# Patient Record
Sex: Male | Born: 1965 | Race: White | Hispanic: No | Smoking: Never smoker
Health system: Southern US, Community
[De-identification: ages and names within clinical notes are randomized; demographics above are authoritative.]

---

## 2007-05-16 ENCOUNTER — Ambulatory Visit (HOSPITAL_COMMUNITY): Admission: RE | Admit: 2007-05-16 | Discharge: 2007-05-16 | Payer: Self-pay | Admitting: Urology

## 2009-03-06 IMAGING — CT CT ABD-PELV W/O CM
3 of 8 series · 12 of 42 positions shown, 18 images · IV contrast (CONTRAST)
Comparison: NONE

CLINICAL DATA: Lower abdominal pain. 

CT ABDOMEN AND PELVIS WITHOUT AND WITH INTRAVENOUS AND FOLLOWING 
ORAL  CONTRAST
TECHNIQUE: Multiple axial 5.0-mm-thick slices at 5.0-mm 
intervals were obtained from the lung base through the pelvis 
following the intravenous administration of 100 cc of Optiray 350 
at a rate of 3 cc per second.  Oral contrast was administered as 
well.  Arterial and venous phase imaging was obtained in the upper 
abdomen with delayed images obtained through the pelvis.

[Series 4: venous · axial · portal-venous · 0.76mm/px · z∈[+872,+1178]mm · 5 of 93 slices shown, 10 images]
[im 16/93  soft-tissue]
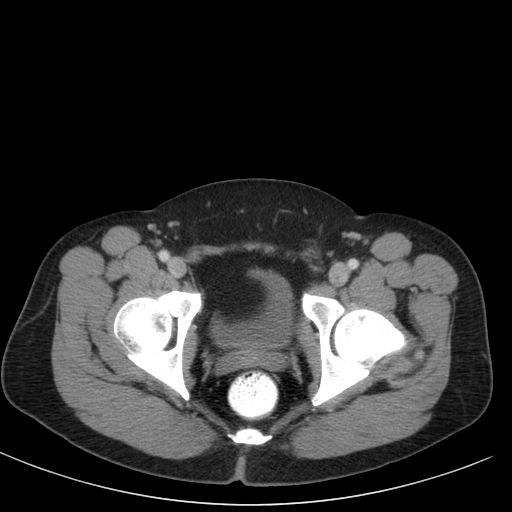
[im 16/93  bone]
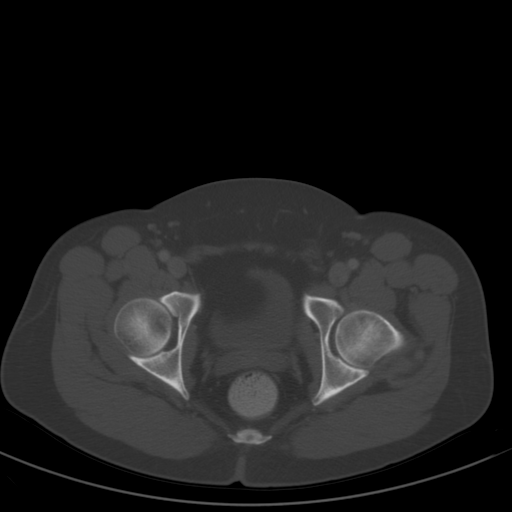
[im 31/93  soft-tissue]
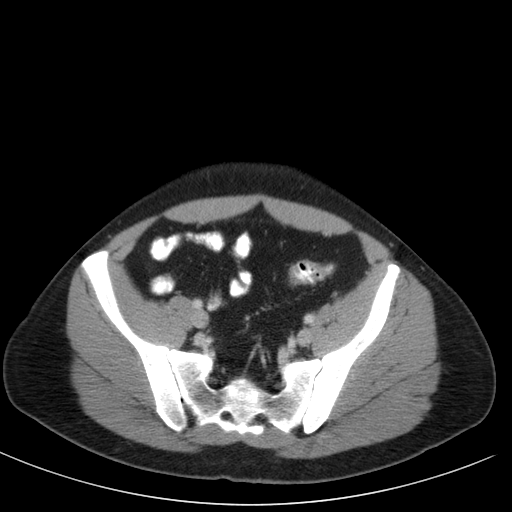
[im 31/93  lung]
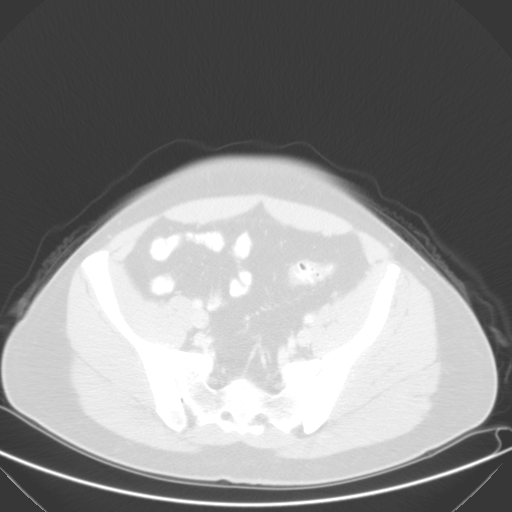
[im 47/93  soft-tissue]
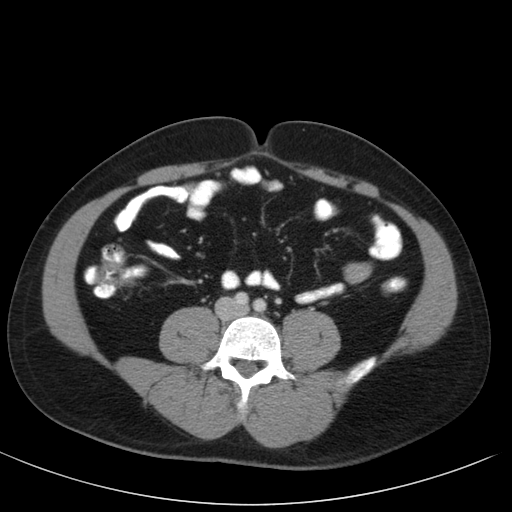
[im 47/93  lung]
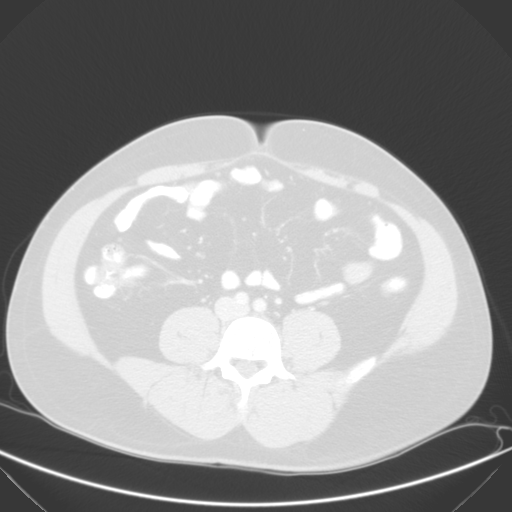
[im 62/93  soft-tissue]
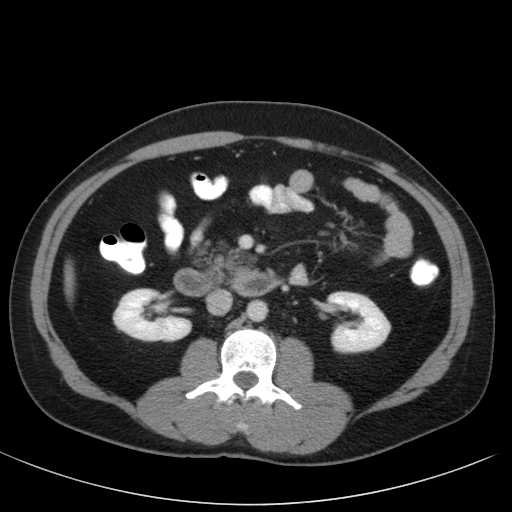
[im 62/93  lung]
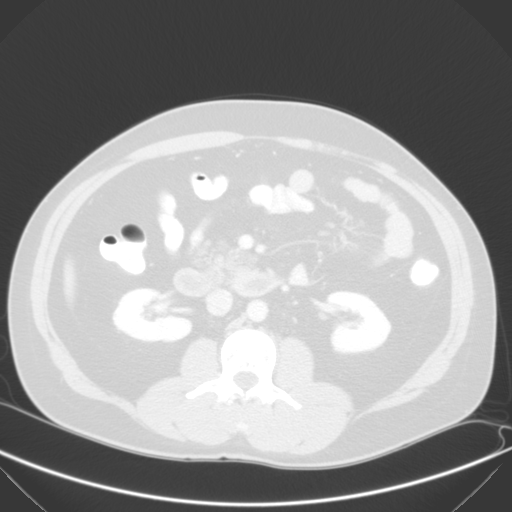
[im 77/93  soft-tissue]
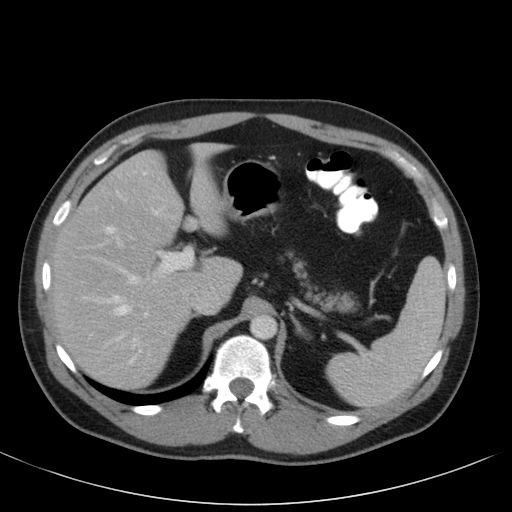
[im 77/93  lung]
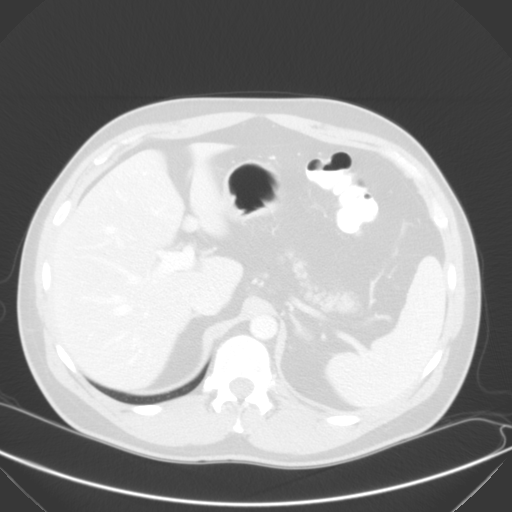

[Series 9: delays · axial · 0.77mm/px · z∈[+908,+1128]mm · 4 of 74 slices shown]
[im 15/74  soft-tissue]
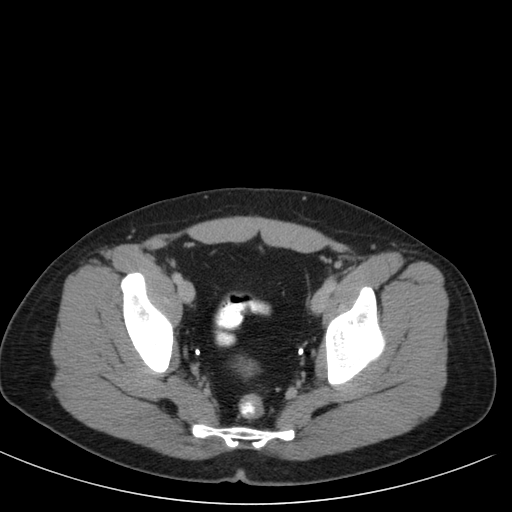
[im 30/74  soft-tissue]
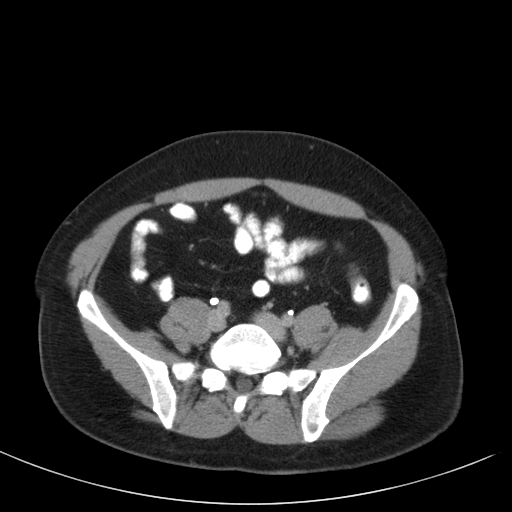
[im 44/74  soft-tissue]
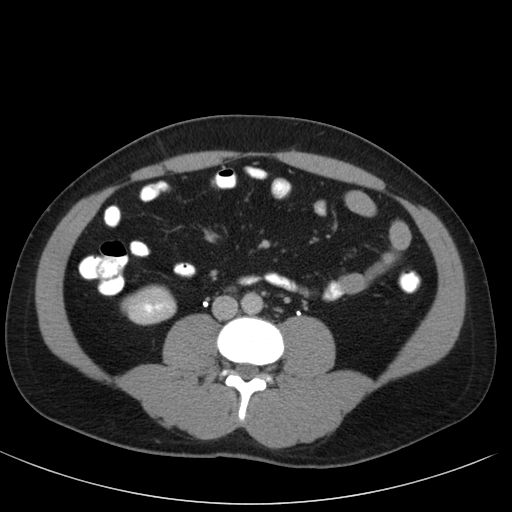
[im 59/74  soft-tissue]
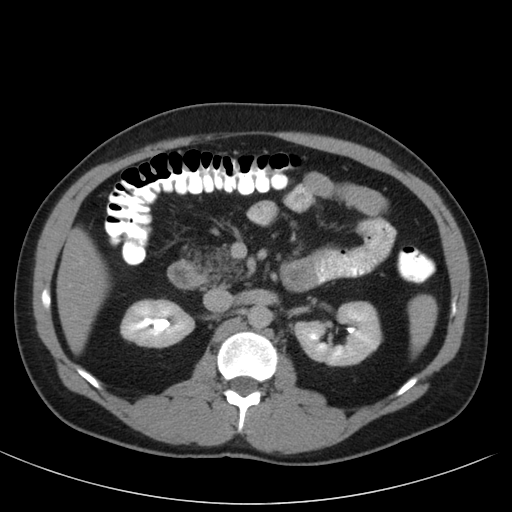

[coronals · coronal · 0.90mm/px · 3 of 81 slices shown, 4 images]
[im 27/81  soft-tissue]
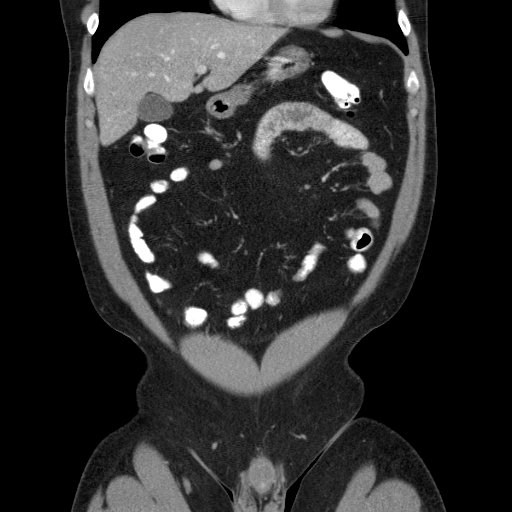
[im 36/81  soft-tissue]
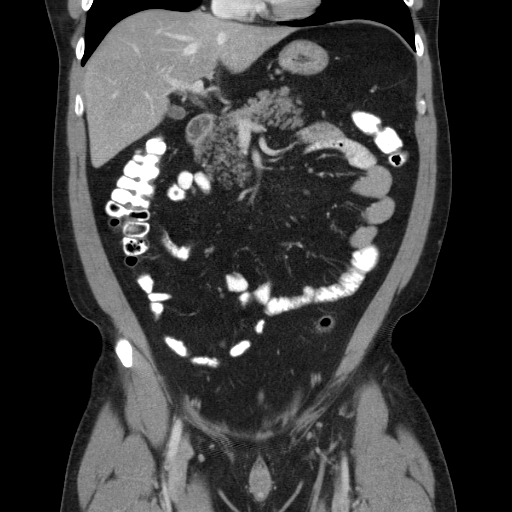
[im 36/81  bone]
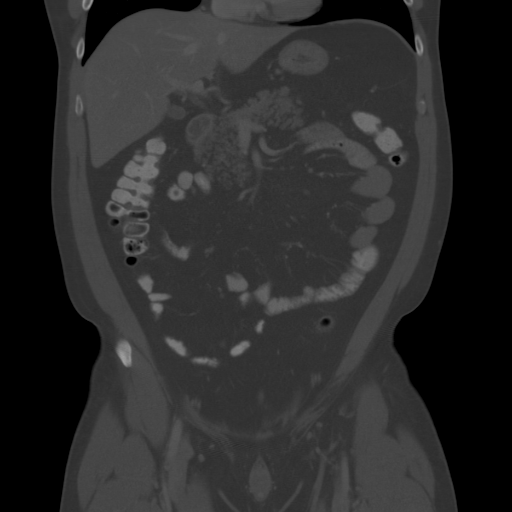
[im 45/81  soft-tissue]
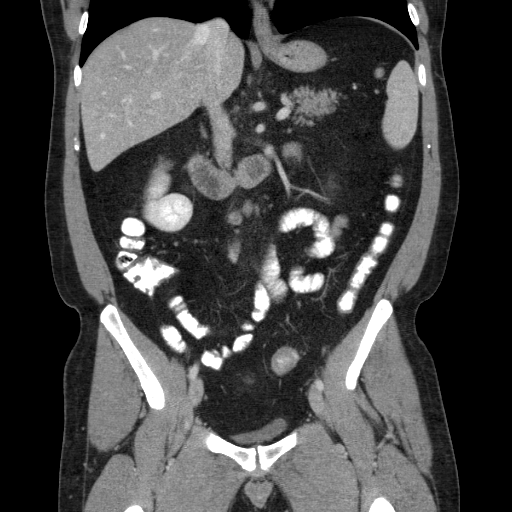

[12 of 42 positions shown; findings below may reference images not displayed]

FINDINGS: No gallstones are identified. No renal calculi are 
identified. The liver, gallbladder, and spleen are unremarkable.  
No focal or diffuse enlargement is noted of the pancreas.  There 
is no evidence of upper abdominal or retroperitoneal mass, 
adenopathy, or aneurysm. No adrenal or renal masses are noted.  
There is no evidence of obstructive uropathy. No bowel, 
mesenteric, pelvic, or inguinal mass, adenopathy, or inflammatory 
process. No evidence of appendicitis, diverticulitis, hernia, or 
bowel obstruction. No lung base mass, infiltrate, edema, or 
effusion. No lytic or blastic lesions are identified.
IMPRESSION: Negative CT of the abdomen and pelvis. Abiel Reatiga 
05/26/2007  Tran Date:  05/27/2007 DAS  JLM

## 2013-07-03 ENCOUNTER — Emergency Department (HOSPITAL_COMMUNITY)
Admission: EM | Admit: 2013-07-03 | Discharge: 2013-07-03 | Disposition: A | Discharge status: Home / Self Care | Payer: Self-pay | Attending: Emergency Medicine | Admitting: Emergency Medicine

## 2013-07-03 ENCOUNTER — Encounter (HOSPITAL_COMMUNITY): Payer: Self-pay | Admitting: Emergency Medicine

## 2013-07-03 ENCOUNTER — Emergency Department (HOSPITAL_COMMUNITY): Payer: Self-pay

## 2013-07-03 DIAGNOSIS — R110 Nausea: Secondary | ICD-10-CM | POA: Insufficient documentation

## 2013-07-03 DIAGNOSIS — Z79899 Other long term (current) drug therapy: Secondary | ICD-10-CM | POA: Insufficient documentation

## 2013-07-03 DIAGNOSIS — R109 Unspecified abdominal pain: Secondary | ICD-10-CM

## 2013-07-03 DIAGNOSIS — R1012 Left upper quadrant pain: Secondary | ICD-10-CM | POA: Insufficient documentation

## 2013-07-03 LAB — COMPREHENSIVE METABOLIC PANEL
ALT: 57 U/L — ABNORMAL HIGH (ref 0–53)
Alkaline Phosphatase: 69 U/L (ref 39–117)
Chloride: 100 mEq/L (ref 96–112)
Glucose, Bld: 89 mg/dL (ref 70–99)
Potassium: 4.2 mEq/L (ref 3.5–5.1)
Sodium: 136 mEq/L (ref 135–145)
Total Bilirubin: 0.6 mg/dL (ref 0.3–1.2)

## 2013-07-03 LAB — LIPASE, BLOOD: Lipase: 28 U/L (ref 11–59)

## 2013-07-03 LAB — CBC WITH DIFFERENTIAL/PLATELET
Basophils Absolute: 0 10*3/uL (ref 0.0–0.1)
Lymphocytes Relative: 27 % (ref 12–46)
Lymphs Abs: 2.6 10*3/uL (ref 0.7–4.0)
MCH: 29.5 pg (ref 26.0–34.0)
MCHC: 35.2 g/dL (ref 30.0–36.0)
MCV: 84 fL (ref 78.0–100.0)
Monocytes Relative: 6 % (ref 3–12)
Platelets: 325 10*3/uL (ref 150–400)
RDW: 12.8 % (ref 11.5–15.5)

## 2013-07-03 MED ORDER — OMEPRAZOLE 20 MG PO CPDR: 20.0000 mg | DELAYED_RELEASE_CAPSULE | Freq: Every day | ORAL | Status: AC

## 2013-07-03 MED ORDER — GI COCKTAIL ~~LOC~~
30.0000 mL | Freq: Once | ORAL | Status: AC
  Administered 2013-07-03: 30 mL via ORAL
  Filled 2013-07-03: qty 30

## 2013-07-03 MED ORDER — PROMETHAZINE HCL 25 MG PO TABS: 25.0000 mg | ORAL_TABLET | Freq: Four times a day (QID) | ORAL | Status: AC | PRN

## 2013-07-03 NOTE — ED Notes (Signed)
Pt c/o intermittent LUQ abdominal pain and nausea x "2-3 weeks."  Pain score 2/10 and increases at bedtime.  Pt sts pain began in L flank x 1 month ago, then moved to L "side" and it is now in LUQ.  Sts he was seen by PCP for kidney stones and all tests came back negative.

## 2013-07-03 NOTE — ED Provider Notes (Signed)
CSN: [id number]     Arrival date & time 07/03/13  1412 History   First MD Initiated Contact with Patient 07/03/13 1548     Chief Complaint  Patient presents with  . Abdominal Pain  . Nausea    HPI Is reports ongoing epigastric and left upper quadrant abdominal pain with associated nausea the past 2-3 weeks.  His symptoms wax and wane.  He denies right upper quadrant pain.  At times he occasionally has some left flank pain.  He has no radiation of pain down into his left groin.  He denies dysuria or urinary frequency.  No chest pain or shortness of breath.  No fevers or chills.  He reports eating hamburgers and pizza seems to make his pain worse.  His symptoms are mild to moderate in severity.  He occasionally tries Prilosec as needed but does not find much relief.   History reviewed. No pertinent past medical history. History reviewed. No pertinent past surgical history. History reviewed. No pertinent family history. History  Substance Use Topics  . Smoking status: Never Smoker   . Smokeless tobacco: Never Used  . Alcohol Use: No    Review of Systems  All other systems reviewed and are negative.    Allergies  Review of patient's allergies indicates no known allergies.  Home Medications   Current Outpatient Rx  Name  Route  Sig  Dispense  Refill  . ibuprofen (ADVIL,MOTRIN) 200 MG tablet   Oral   Take 400 mg by mouth every 6 (six) hours as needed.         Marland Kitchen omeprazole (PRILOSEC) 20 MG capsule   Oral   Take 1 capsule (20 mg total) by mouth daily.   30 capsule   0   . promethazine (PHENERGAN) 25 MG tablet   Oral   Take 1 tablet (25 mg total) by mouth every 6 (six) hours as needed for nausea or vomiting.   12 tablet   0    BP 121/66  Pulse 71  Temp(Src) 98 F (36.7 C) (Oral)  Resp 18  SpO2 95% Physical Exam  Nursing note and vitals reviewed. Constitutional: He is oriented to person, place, and time. He appears well-developed and well-nourished.  HENT:  Head:  Normocephalic and atraumatic.  Eyes: EOM are normal.  Neck: Normal range of motion.  Cardiovascular: Normal rate, regular rhythm, normal heart sounds and intact distal pulses.   Pulmonary/Chest: Effort normal and breath sounds normal. No respiratory distress.  Abdominal: Soft. He exhibits no distension. There is no tenderness.  Musculoskeletal: Normal range of motion.  Neurological: He is alert and oriented to person, place, and time.  Skin: Skin is warm and dry.  Psychiatric: He has a normal mood and affect. Judgment normal.    ED Course  Procedures (including critical care time) Labs Review Labs Reviewed  COMPREHENSIVE METABOLIC PANEL - Abnormal; Notable for the following:    ALT 57 (*)    GFR calc non Af Amer 66 (*)    GFR calc Af Amer 76 (*)    All other components within normal limits  CBC WITH DIFFERENTIAL  LIPASE, BLOOD   Imaging Review US Abdomen Complete  07/03/2013   CLINICAL DATA:  Abdominal pain, nausea, left upper quadrant pain.  EXAM: ULTRASOUND ABDOMEN COMPLETE  COMPARISON:  None.  FINDINGS: Gallbladder:  No gallstones or wall thickening visualized. No sonographic Murphy sign noted.  Common bile duct:  Diameter: Normal caliber, 5-6 mm  Liver:  Increased echotexture compatible with  fatty infiltration. No focal abnormality or biliary ductal dilatation.  IVC:  No abnormality visualized.  Pancreas:  Not visualized due to overlying bowel gas  Spleen:  Size and appearance within normal limits.  Right Kidney:  Length: 11.2 cm. Echogenicity within normal limits. No mass or hydronephrosis visualized.  Left Kidney:  Length: 10.3 cm. Echogenicity within normal limits. No mass or hydronephrosis visualized.  Abdominal aorta:  No aneurysm visualized.  Much of the aorta is obscured by bowel gas.  Other findings:  None.  IMPRESSION: Increased echotexture throughout the liver compatible with fatty infiltration.  No evidence of cholelithiasis or acute cholecystitis.  Visualization of midline  structures limited due to overlying bowel gas.   Electronically Signed   By: Charlett Nose M.D.   On: 07/03/2013 19:11  I personally reviewed the imaging tests through PACS system I reviewed available ER/hospitalization records through the EMR   EKG Interpretation   None       MDM   1. Abdominal pain    7:38 PM Patient feels better at this time.  Discharge home in good condition.  Place on PPI.  GI followup.  May require endoscopy in the future.  Lyanne Co, MD 07/03/13 [phone]

## 2013-07-03 NOTE — ED Notes (Signed)
Informed pt that [place] has another pt to do before they come to scan his abd.

## 2013-07-03 NOTE — Progress Notes (Signed)
[facility] provided pt with a list of primary care resources. Patient stated that he was pending insurance on Jan. 1st.

## 2015-02-05 IMAGING — US US ABDOMEN COMPLETE
1 series · 14 of 25 positions shown · non-contrast
Comparison: None.

CLINICAL DATA: Abdominal pain, nausea, left upper quadrant pain.

EXAM:
ULTRASOUND ABDOMEN COMPLETE

[Series 1: us abdomen complete · 0.27mm/px · 14 of 72 slices shown]
[im 1/72]
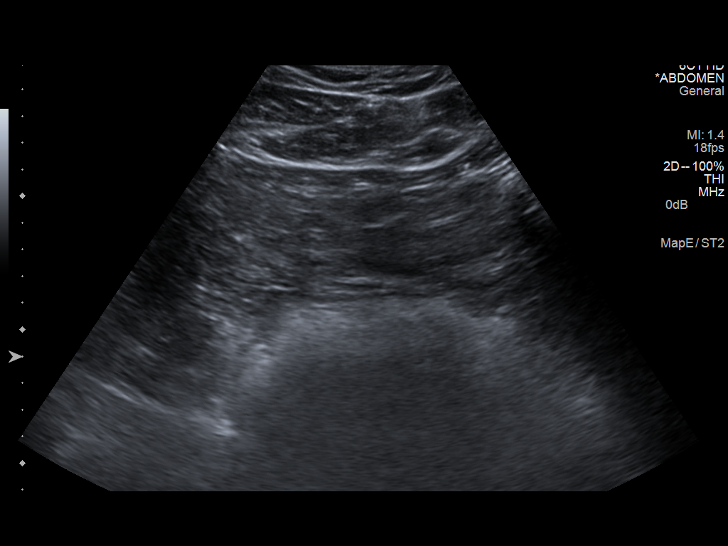
[im 6/72]
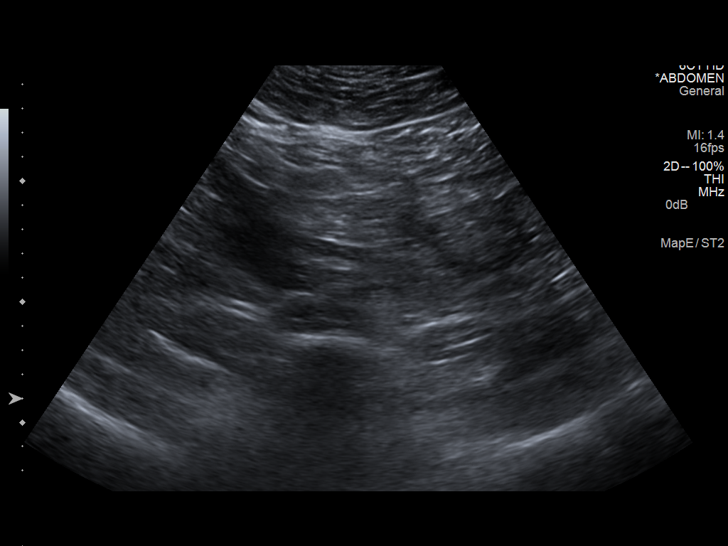
[im 12/72]
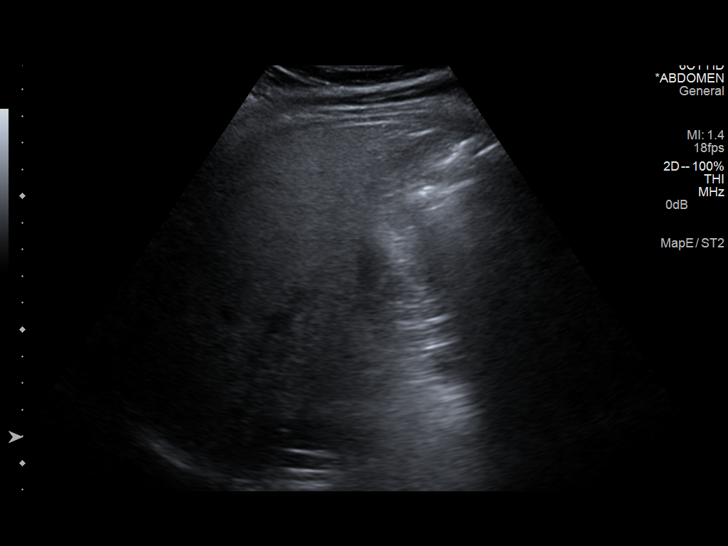
[im 18/72]
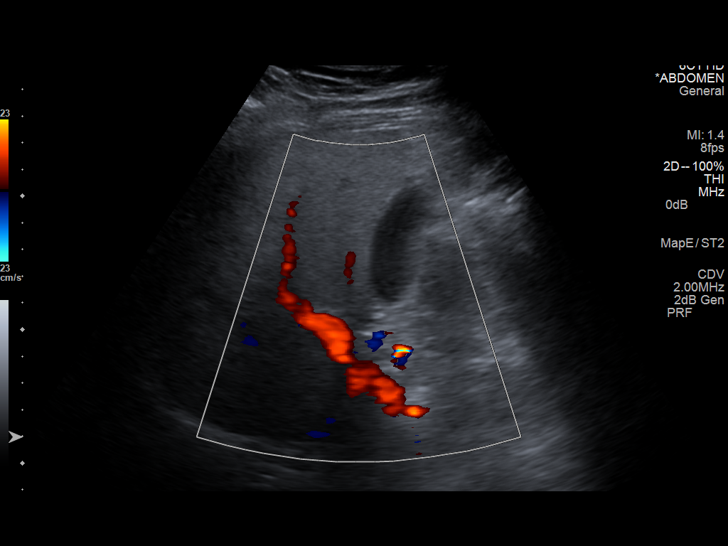
[im 24/72]
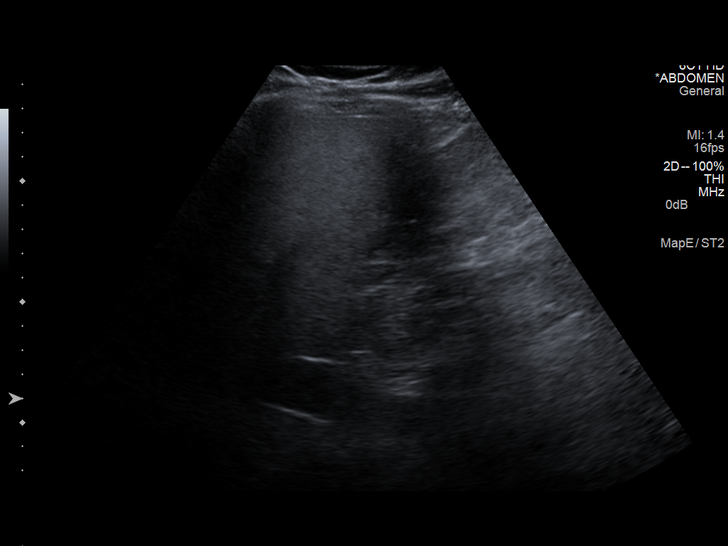
[im 27/72]
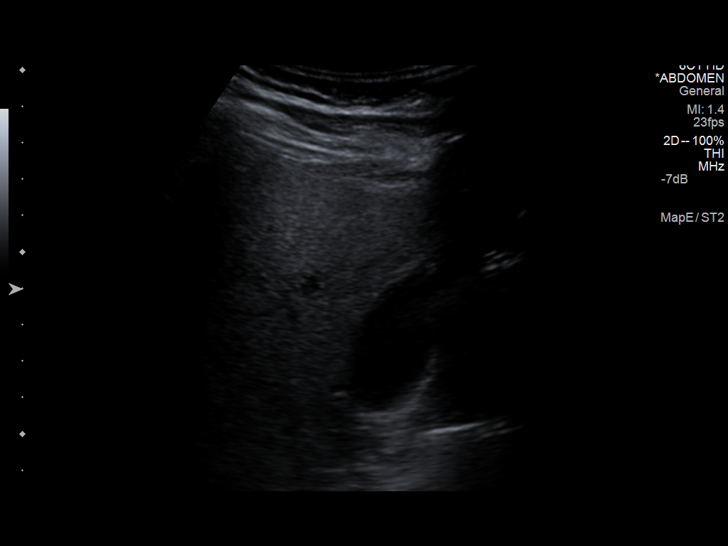
[im 33/72]
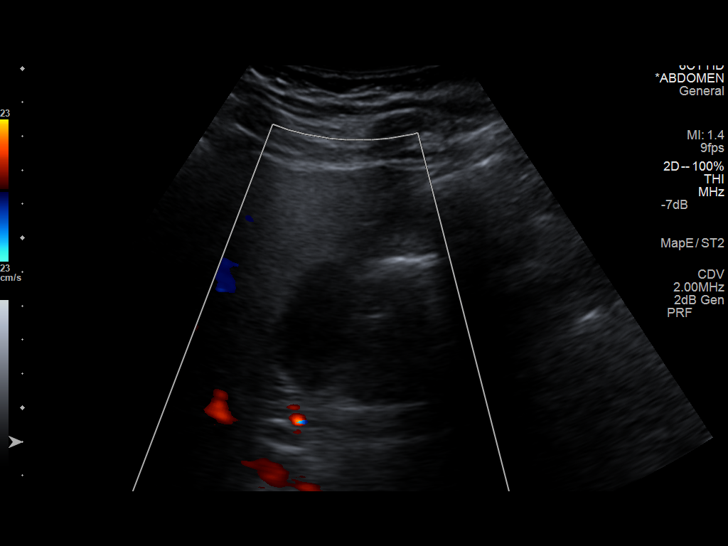
[im 39/72]
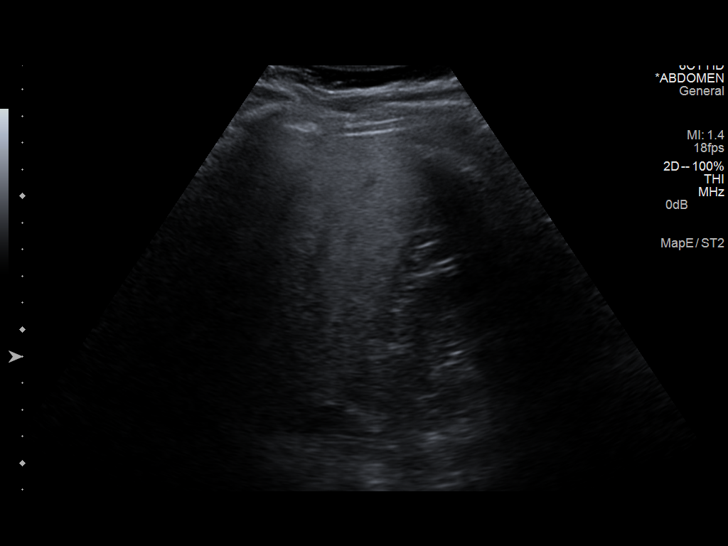
[im 45/72]
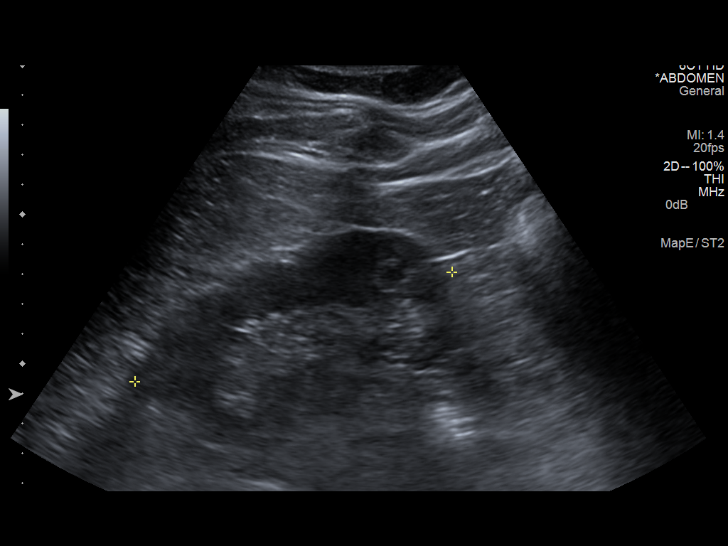
[im 48/72]
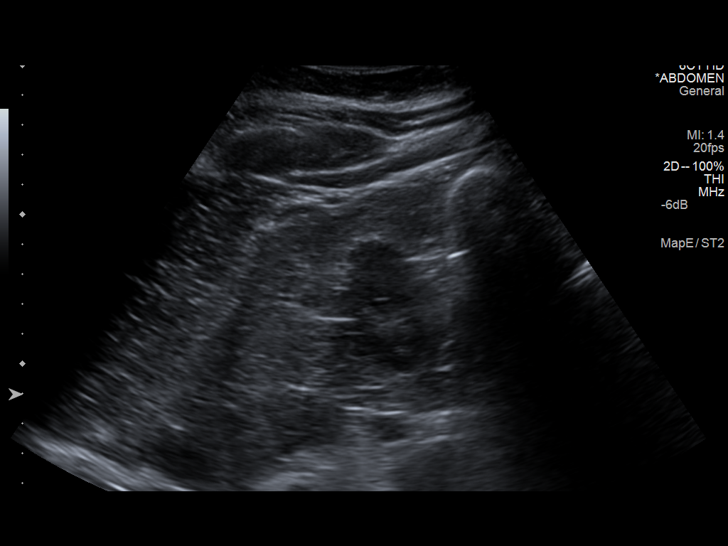
[im 54/72]
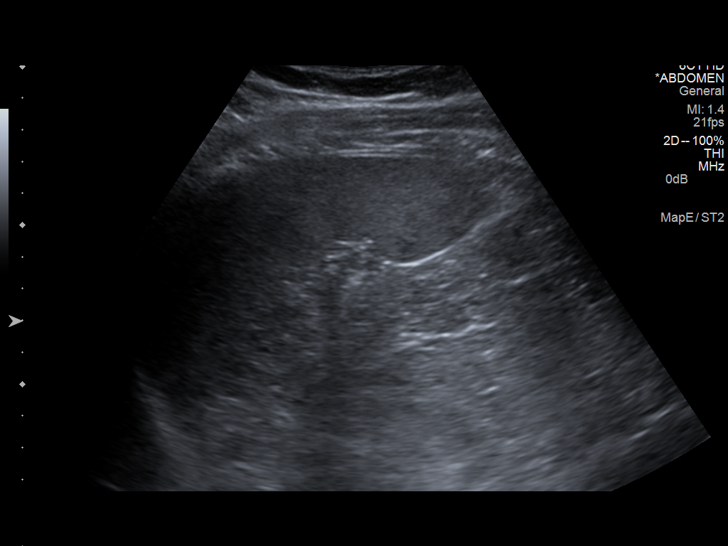
[im 60/72]
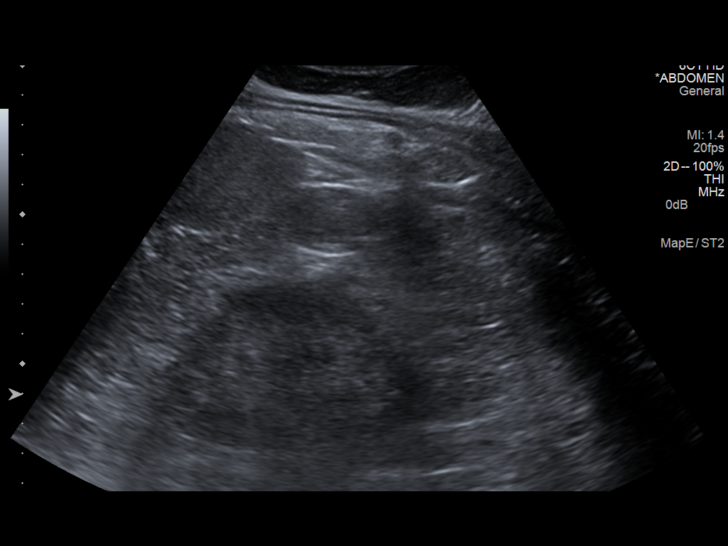
[im 66/72]
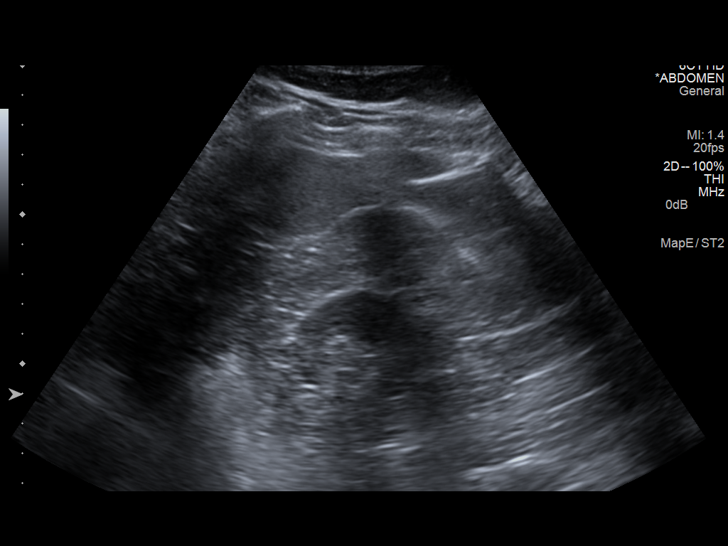
[im 72/72]
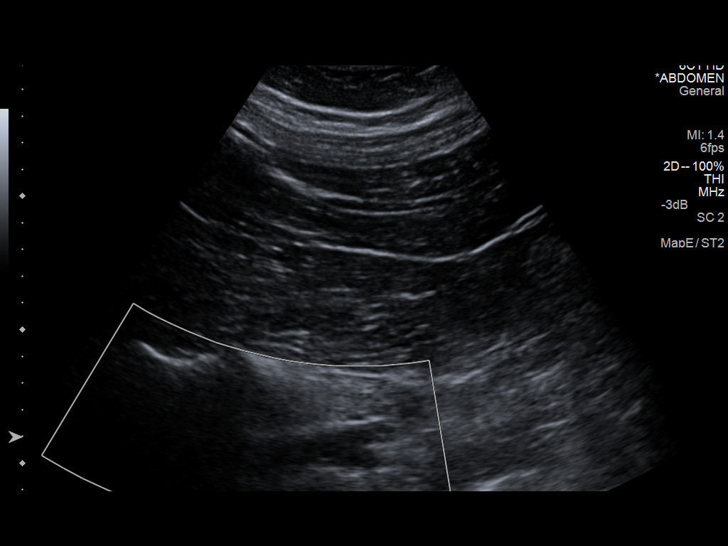

[14 of 25 positions shown; findings below may reference images not displayed]

FINDINGS: Gallbladder:

No gallstones or wall thickening visualized. No sonographic Murphy
sign noted.

Common bile duct:

Diameter: Normal caliber, 5-6 mm

Liver:

Increased echotexture compatible with fatty infiltration. No focal
abnormality or biliary ductal dilatation.

IVC:

No abnormality visualized.

Pancreas:

Not visualized due to overlying bowel gas

Spleen:

Size and appearance within normal limits.

Right Kidney:

Length: 11.2 cm. Echogenicity within normal limits. No mass or
hydronephrosis visualized.

Left Kidney:

Length: 10.3 cm. Echogenicity within normal limits. No mass or
hydronephrosis visualized.

Abdominal aorta:

No aneurysm visualized.  Much of the aorta is obscured by bowel gas.

Other findings:

None.
IMPRESSION: Increased echotexture throughout the liver compatible with fatty
infiltration.

No evidence of cholelithiasis or acute cholecystitis.

Visualization of midline structures limited due to overlying bowel
gas.

## 2018-03-03 ENCOUNTER — Other Ambulatory Visit: Payer: Self-pay | Admitting: Gastroenterology

## 2018-03-03 DIAGNOSIS — R945 Abnormal results of liver function studies: Principal | ICD-10-CM

## 2018-03-03 DIAGNOSIS — R7989 Other specified abnormal findings of blood chemistry: Secondary | ICD-10-CM

## 2018-03-11 ENCOUNTER — Other Ambulatory Visit: Payer: Self-pay

## 2018-03-16 ENCOUNTER — Telehealth: Payer: Self-pay | Admitting: Radiology

## 2018-03-16 NOTE — Telephone Encounter (Signed)
Patient canceled app't for [place] scheduled 03/17/2018 and does not want to reschedule. States that he has been told by his physician that he will not need to have [place] at present.  Gresia Isidoro Carmell [place], [place] 03/16/2018 10:58 AM

## 2018-03-17 ENCOUNTER — Other Ambulatory Visit: Payer: Self-pay

## 2023-12-07 ENCOUNTER — Other Ambulatory Visit (HOSPITAL_BASED_OUTPATIENT_CLINIC_OR_DEPARTMENT_OTHER): Payer: Self-pay
